# Patient Record
Sex: Male | Born: 1990 | Race: White | Hispanic: No | Marital: Single
Health system: Southern US, Community
[De-identification: ages and names within clinical notes are randomized; demographics above are authoritative.]

---

## 2019-10-22 ENCOUNTER — Encounter (HOSPITAL_COMMUNITY): Payer: Self-pay

## 2019-10-22 ENCOUNTER — Other Ambulatory Visit: Payer: Self-pay

## 2019-10-22 ENCOUNTER — Emergency Department (HOSPITAL_COMMUNITY)
Admission: EM | Admit: 2019-10-22 | Discharge: 2019-10-22 | Disposition: A | Discharge status: Home / Self Care | Payer: BC Managed Care – PPO | Attending: Emergency Medicine | Admitting: Emergency Medicine

## 2019-10-22 ENCOUNTER — Emergency Department (HOSPITAL_COMMUNITY): Payer: BC Managed Care – PPO

## 2019-10-22 DIAGNOSIS — S61213A Laceration without foreign body of left middle finger without damage to nail, initial encounter: Secondary | ICD-10-CM | POA: Insufficient documentation

## 2019-10-22 DIAGNOSIS — Z23 Encounter for immunization: Secondary | ICD-10-CM | POA: Diagnosis not present

## 2019-10-22 DIAGNOSIS — Y939 Activity, unspecified: Secondary | ICD-10-CM | POA: Diagnosis not present

## 2019-10-22 DIAGNOSIS — Y990 Civilian activity done for income or pay: Secondary | ICD-10-CM | POA: Diagnosis not present

## 2019-10-22 DIAGNOSIS — W268XXA Contact with other sharp object(s), not elsewhere classified, initial encounter: Secondary | ICD-10-CM | POA: Insufficient documentation

## 2019-10-22 DIAGNOSIS — Y929 Unspecified place or not applicable: Secondary | ICD-10-CM | POA: Insufficient documentation

## 2019-10-22 MED ORDER — TETANUS-DIPHTH-ACELL PERTUSSIS 5-2.5-18.5 LF-MCG/0.5 IM SUSP
0.5000 mL | Freq: Once | INTRAMUSCULAR | Status: AC
  Administered 2019-10-22: 0.5 mL via INTRAMUSCULAR
  Filled 2019-10-22: qty 0.5

## 2019-10-22 MED ORDER — BUPIVACAINE HCL (PF) 0.5 % IJ SOLN: 30.0000 mL | Freq: Once | INTRAMUSCULAR | Status: DC

## 2019-10-22 MED ORDER — DOXYCYCLINE HYCLATE 100 MG PO TABS: 100.0000 mg | ORAL_TABLET | Freq: Two times a day (BID) | ORAL | 0 refills | Status: AC

## 2019-10-22 MED ORDER — HYDROCODONE-ACETAMINOPHEN 5-325 MG PO TABS: 1.0000 | ORAL_TABLET | ORAL | 0 refills | Status: AC | PRN

## 2019-10-22 MED ORDER — BUPIVACAINE HCL 0.5 % IJ SOLN
50.0000 mL | Freq: Once | INTRAMUSCULAR | Status: DC
  Filled 2019-10-22: qty 50

## 2019-10-22 NOTE — ED Notes (Signed)
Pt verbalized understanding of discharge paperwork.

## 2019-10-22 NOTE — ED Triage Notes (Signed)
Patient here with left middle finger laceration after cutting on sheet metal from car. Arrived with dressing from urgent care, complains of numbness to same

## 2019-10-22 NOTE — Discharge Instructions (Addendum)
Schedule to see Dr. Melvyn Novas for recheck this week.  Call Monday for appointment time

## 2019-10-22 NOTE — ED Provider Notes (Signed)
[facility] EMERGENCY DEPARTMENT Provider Note   CSN: [id number] Arrival date & time: 10/22/19  1338     History No chief complaint on file.   Robert Hinton is a 29 y.o. male.  The history is provided by the patient. No language interpreter was used.  Hand Pain This is a new problem. The current episode started 3 to 5 hours ago. The problem occurs constantly. The problem has not changed since onset.Nothing aggravates the symptoms. Nothing relieves the symptoms. He has tried nothing for the symptoms. The treatment provided no relief.       History reviewed. No pertinent past medical history.  There are no problems to display for this patient.   History reviewed. No pertinent surgical history.     No family history on file.  Social History   Tobacco Use  . Smoking status: Not on file  Substance Use Topics  . Alcohol use: Not on file  . Drug use: Not on file    Home Medications Prior to Admission medications   Not on File    Allergies    Patient has no known allergies.  Review of Systems   Review of Systems  All other systems reviewed and are negative.   Physical Exam Updated Vital Signs BP 138/85 (BP Location: Right Arm)   Pulse 78   Temp 99 F (37.2 C) (Oral)   Resp 14   SpO2 100%   Physical Exam Vitals reviewed.  Musculoskeletal:        General: Tenderness present.     Comments: 1.6cm laceration belos distal phalanx. nv intact  From   Skin:    General: Skin is warm.  Neurological:     General: No focal deficit present.     Mental Status: He is alert.  Psychiatric:        Mood and Affect: Mood normal.           ED Results / Procedures / Treatments   Labs (all labs ordered are listed, but only abnormal results are displayed) Labs Reviewed - No data to display  EKG None  Radiology DG Finger Middle Left  Result Date: 10/22/2019 CLINICAL DATA:  Laceration to the distal left middle finger. EXAM: LEFT  MIDDLE FINGER 2+V COMPARISON:  None. FINDINGS: Three views of the left middle finger were obtained. Negative for fracture or dislocation. Soft tissue defect at the tip of the finger compatible with a laceration. No evidence for a radiopaque foreign body. IMPRESSION: Soft tissue injury at the tip of the middle finger without acute bone abnormality. Electronically Signed   By: Richarda Overlie M.D.   On: 10/22/2019 14:51    Procedures .Marland KitchenLaceration Repair  Date/Time: 10/22/2019 4:28 PM Performed by: Elson Areas, PA-C Authorized by: Elson Areas, PA-C   Consent:    Consent obtained:  Verbal   Consent given by:  Patient   Risks discussed:  Infection   Alternatives discussed:  Observation Anesthesia (see MAR for exact dosages):    Anesthesia method:  Nerve block   Block anesthetic:  Bupivacaine 0.5% w/o epi   Block technique:  Digital   Block injection procedure:  Anatomic landmarks identified   Block outcome:  Anesthesia achieved Laceration details:    Location:  Finger   Finger location:  L long finger   Length (cm):  3   Depth (mm):  2 Repair type:    Repair type:  Simple Pre-procedure details:    Preparation:  Patient was prepped  and draped in usual sterile fashion Exploration:    Wound extent: no nerve damage noted, no underlying fracture noted and no vascular damage noted     Contaminated: no   Treatment:    Area cleansed with:  Betadine, saline and Shur-Clens   Amount of cleaning:  Standard   Irrigation solution:  Sterile saline Skin repair:    Repair method:  Sutures   Suture material:  Plain gut   Suture technique:  Simple interrupted   Number of sutures:  9 Approximation:    Approximation:  Loose Post-procedure details:    Patient tolerance of procedure:  Tolerated well, no immediate complications   (including critical care time)  Medications Ordered in ED Medications  Tdap (BOOSTRIX) injection 0.5 mL (has no administration in time range)  bupivacaine  (MARCAINE) 0.5 % (with pres) injection 50 mL (has no administration in time range)    ED Course  I have reviewed the triage vital signs and the nursing notes.  Pertinent labs & imaging results that were available during my care of the patient were reviewed by me and considered in my medical decision making (see chart for details).    MDM Rules/Calculators/A&P                          I spoke to Dr. Caralyn Guile who will see this week  Final Clinical Impression(s) / ED Diagnoses Final diagnoses:  None    Rx / DC Orders ED Discharge Orders         Ordered    HYDROcodone-acetaminophen (NORCO/VICODIN) 5-325 MG tablet  Every 4 hours PRN     Discontinue  Reprint     10/22/19 1553    doxycycline (VIBRA-TABS) 100 MG tablet  2 times daily     Discontinue  Reprint     10/22/19 1553        An After Visit Summary was printed and given to the patient.    Fransico Meadow, [place] 10/22/19 1631    Pattricia Boss, MD 10/27/19 [phone]

## 2021-02-26 IMAGING — DX DG FINGER MIDDLE 2+V*L*
3 series · 3 of 3 positions shown · non-contrast
Comparison: None.

CLINICAL DATA: Laceration to the distal left middle finger.

EXAM:
LEFT MIDDLE FINGER 2+V

[finger ap]
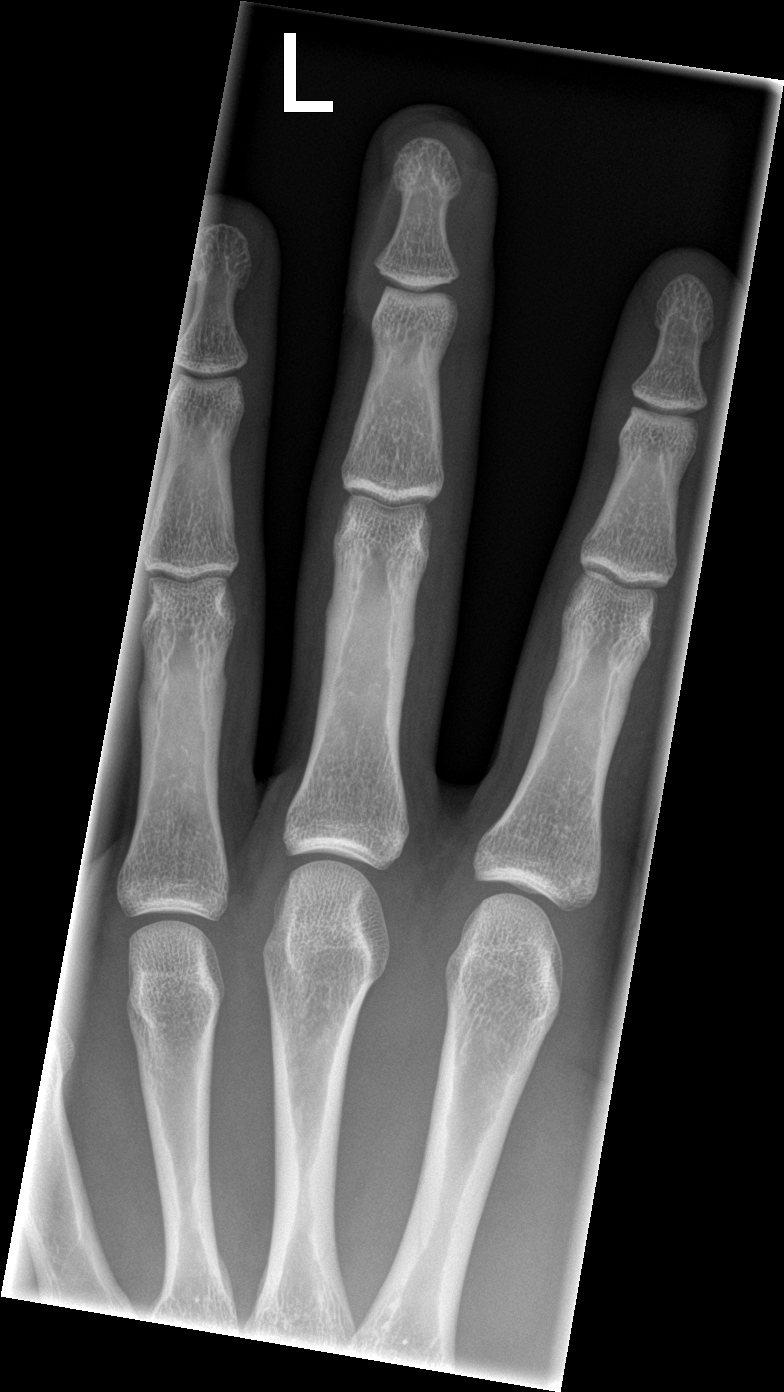

[finger obl]
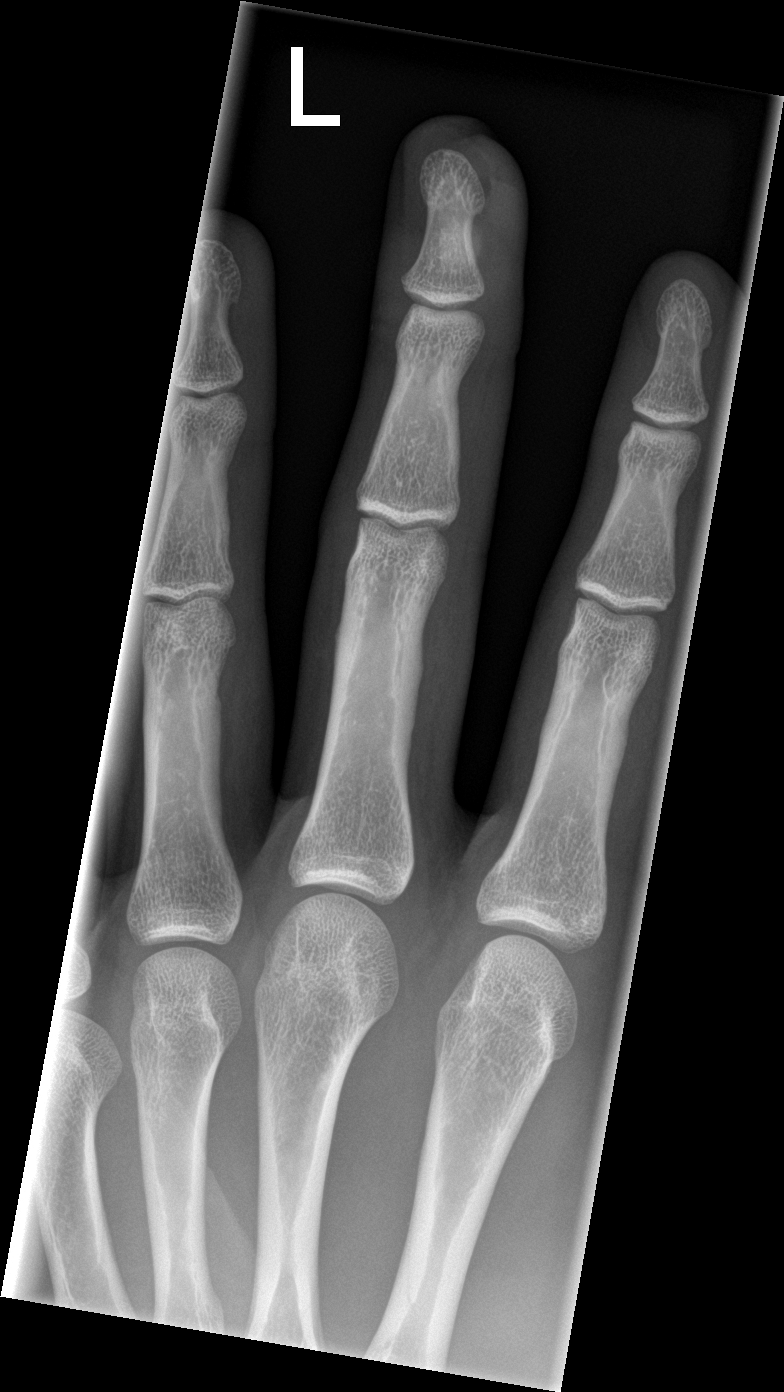

[finger lat]
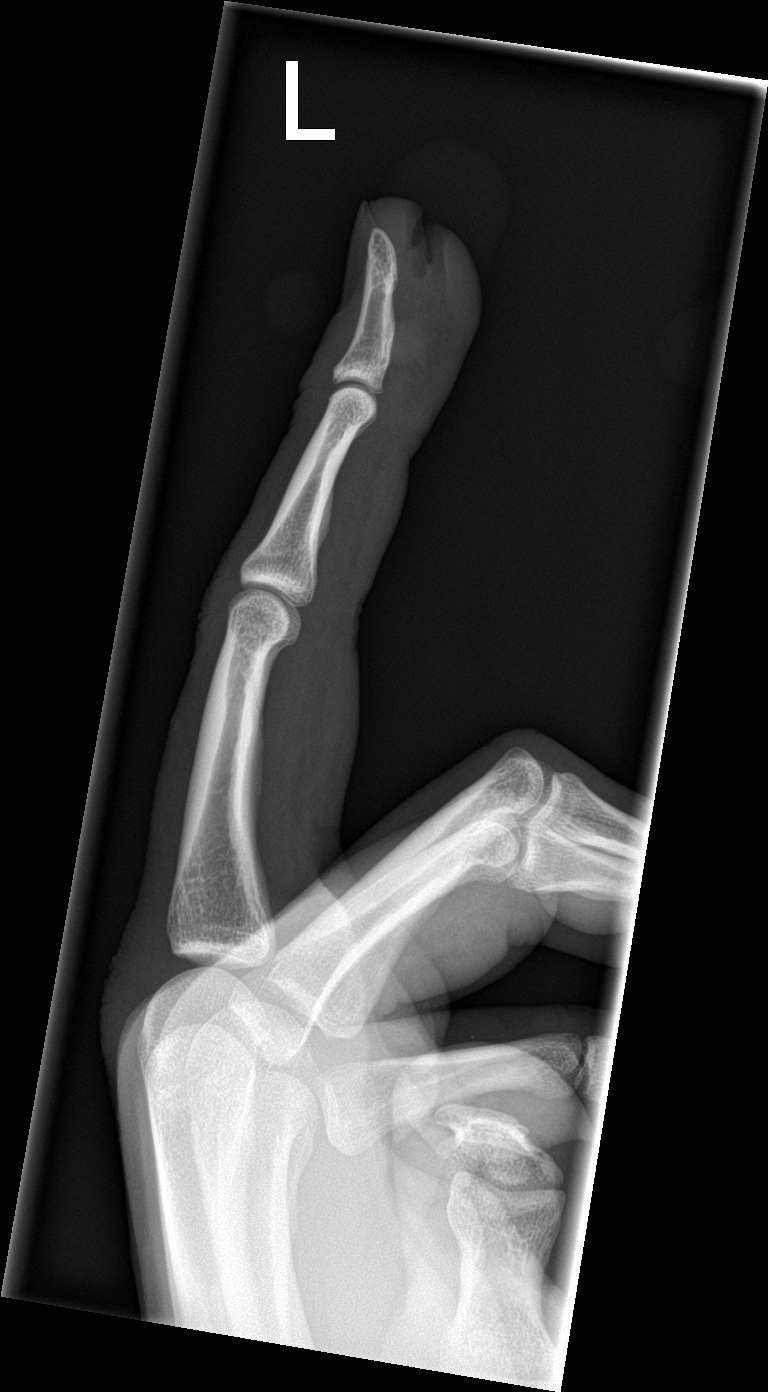

[3 of 3 positions shown; findings below may reference images not displayed]

FINDINGS: Three views of the left middle finger were obtained. Negative for
fracture or dislocation. Soft tissue defect at the tip of the finger
compatible with a laceration. No evidence for a radiopaque foreign
body.
IMPRESSION: Soft tissue injury at the tip of the middle finger without acute
bone abnormality.
